# Patient Record
Sex: Male | Born: 1940 | Race: White | Hispanic: No | Smoking: Never smoker
Health system: Southern US, Community
[De-identification: ages and names within clinical notes are randomized; demographics above are authoritative.]

## PROBLEM LIST (undated history)

## (undated) DIAGNOSIS — E119 Type 2 diabetes mellitus without complications: Secondary | ICD-10-CM

## (undated) HISTORY — PX: TONSILLECTOMY AND ADENOIDECTOMY: SHX28

## (undated) HISTORY — DX: Type 2 diabetes mellitus without complications: E11.9

---

## 2009-06-19 ENCOUNTER — Ambulatory Visit: Payer: Self-pay | Admitting: Gastroenterology

## 2012-05-11 ENCOUNTER — Ambulatory Visit: Payer: Self-pay | Admitting: General Practice

## 2012-07-04 ENCOUNTER — Ambulatory Visit: Payer: Self-pay | Admitting: Unknown Physician Specialty

## 2014-06-15 ENCOUNTER — Ambulatory Visit: Payer: Self-pay | Admitting: Family Medicine

## 2014-07-16 ENCOUNTER — Ambulatory Visit: Payer: Self-pay | Admitting: Family Medicine

## 2015-07-30 IMAGING — CT CT CHEST W/ CM
2 of 3 series · 15 of 36 positions shown, 18 images · IV contrast (omnipaque)
Comparison: Report of chest radiograph performed 06/06/2014, images
not available

CLINICAL DATA: Subsequent evaluation of abnormal opacity right lung
on recent chest radiograph

EXAM:
CT CHEST WITH CONTRAST
TECHNIQUE: Multidetector CT imaging of the chest was performed during
intravenous contrast administration.
CONTRAST:  75 mL Omnipaque 350

[Series 2: routine chest with · axial · 0.77mm/px · z∈[-717,-432]mm · 12 of 67 slices shown, 15 images]
[im 5/67  mediastinal]
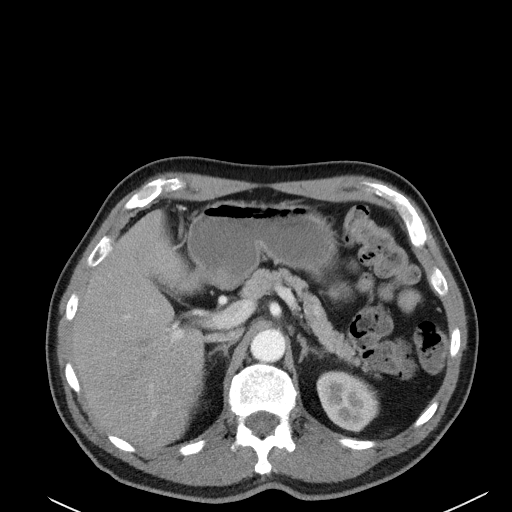
[im 5/67  lung]
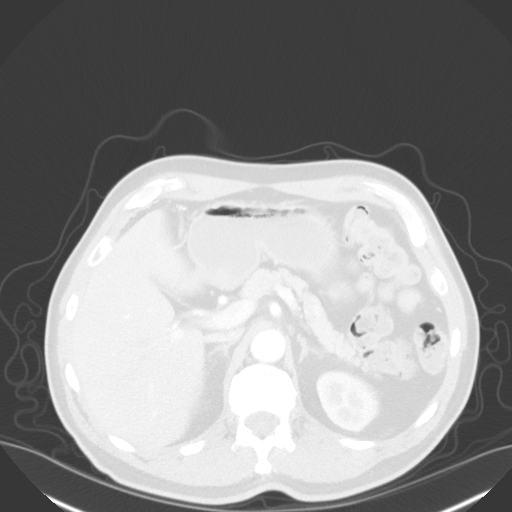
[im 10/67  lung]
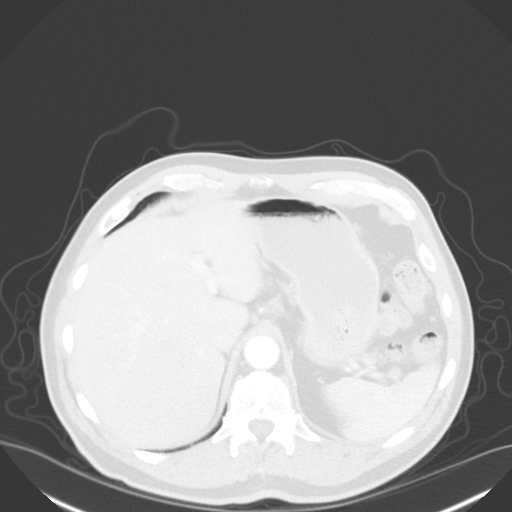
[im 15/67  lung]
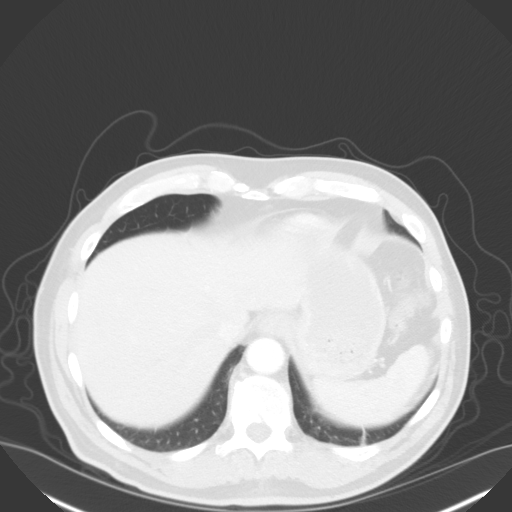
[im 20/67  lung]
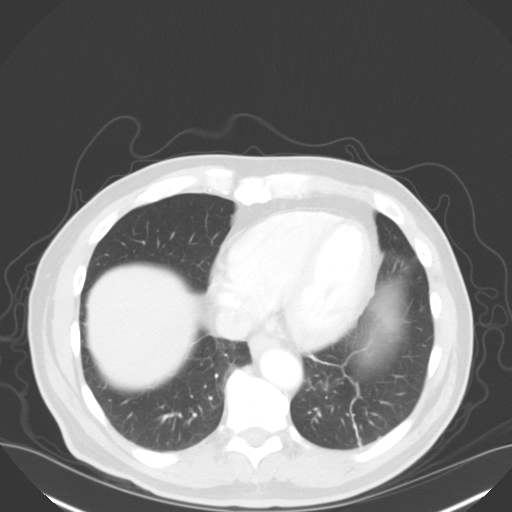
[im 25/67  mediastinal]
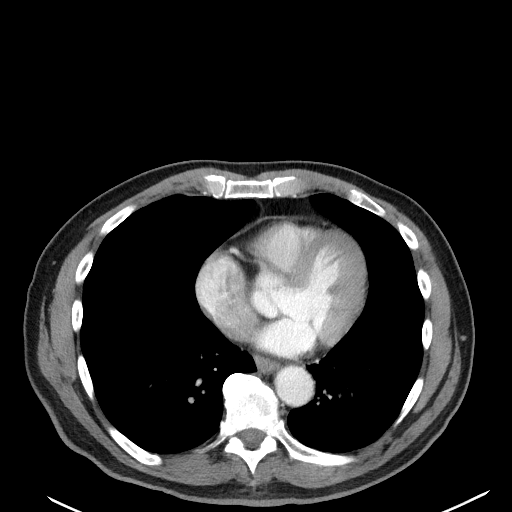
[im 25/67  lung]
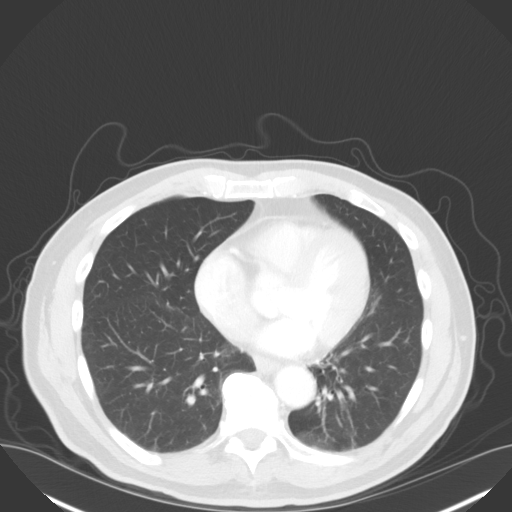
[im 30/67  lung]
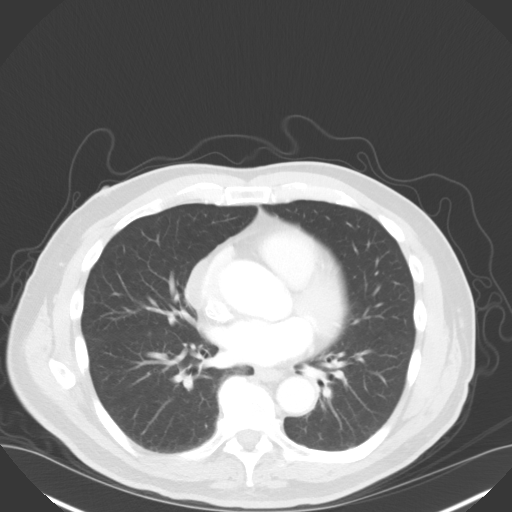
[im 37/67  lung]
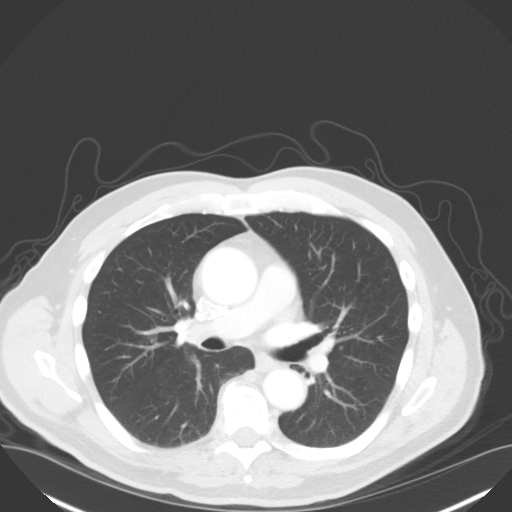
[im 42/67  lung]
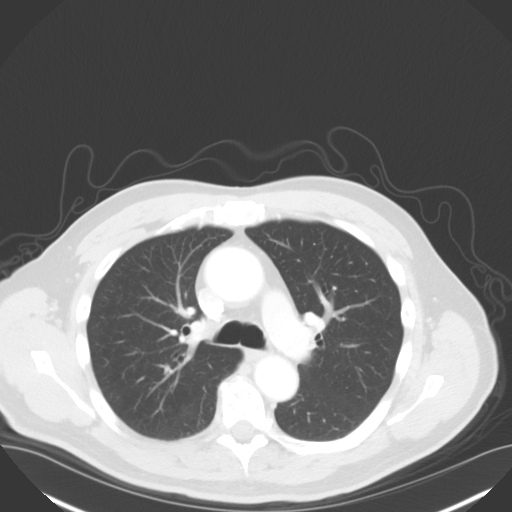
[im 47/67  mediastinal]
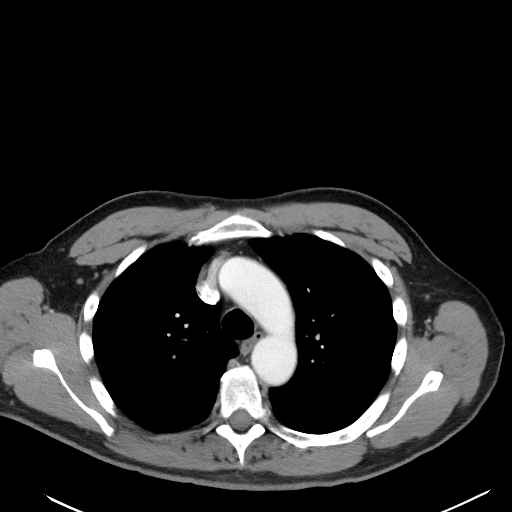
[im 47/67  lung]
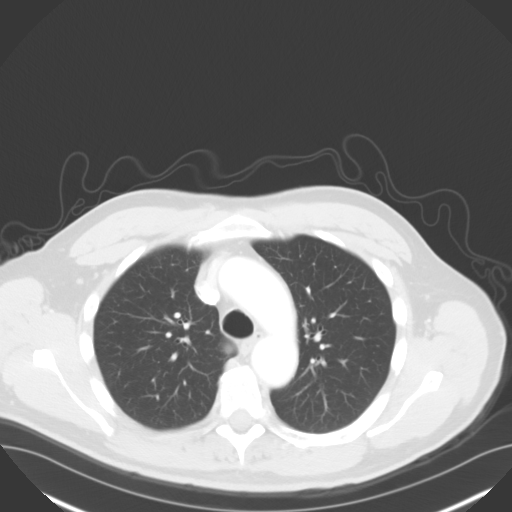
[im 52/67  lung]
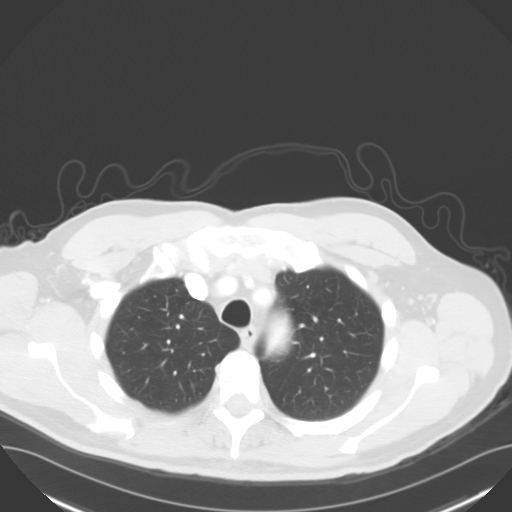
[im 57/67  lung]
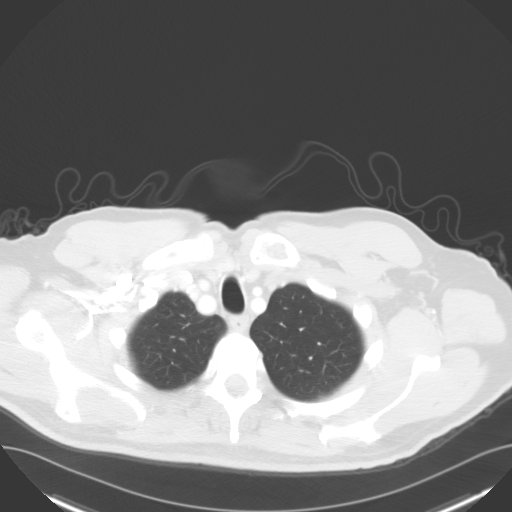
[im 62/67  lung]
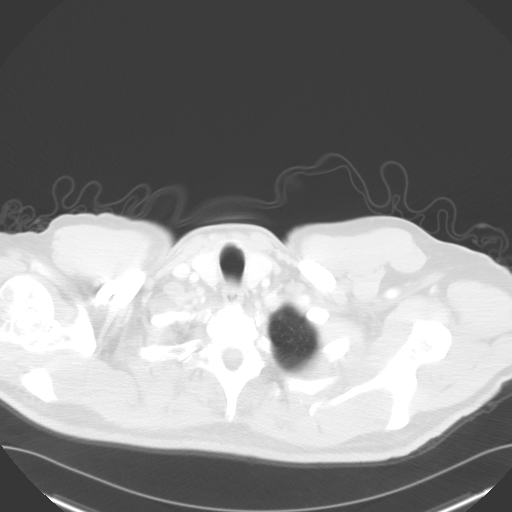

[Series 5: cor routine chest with · coronal · 0.66mm/px · 3 of 149 slices shown]
[im 30/149  lung]
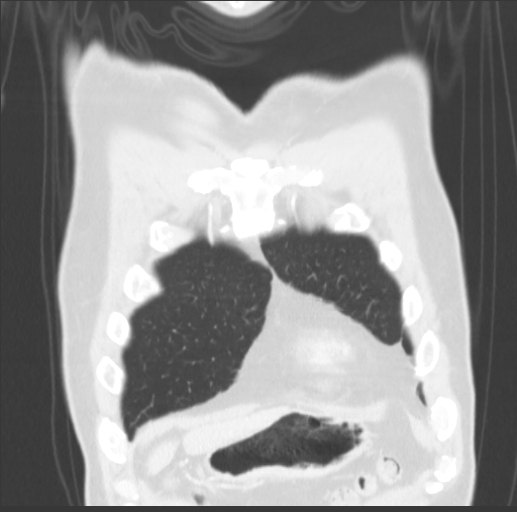
[im 60/149  lung]
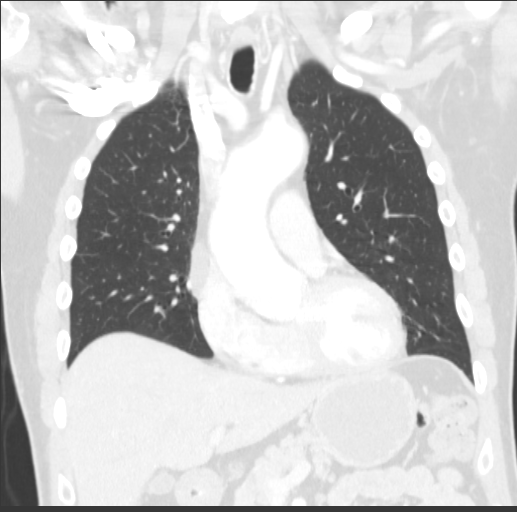
[im 89/149  lung]
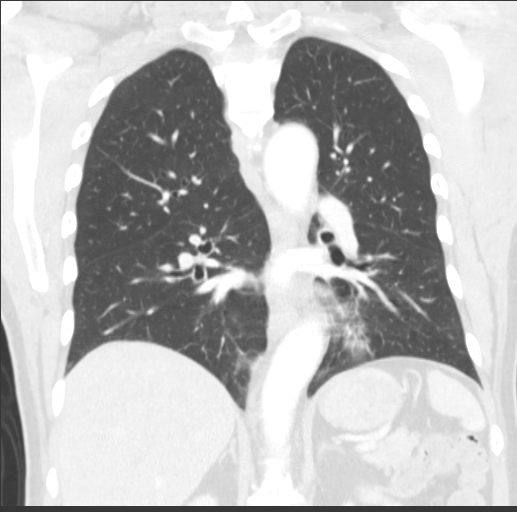

[15 of 36 positions shown; findings below may reference images not displayed]

FINDINGS: Thoracic inlet is normal. Thyroid is normal. No significant hilar or
mediastinal adenopathy. Mediastinal contents normal. No pleural or
pericardial effusion. Heart size normal.

Images through the upper abdomen unremarkable. No acute
musculoskeletal findings.

In the posterior left lower lobe there is mild discoid atelectasis.
The left lung is clear otherwise. On the right, there is very mild
perihilar infiltrate and there are several tiny nodules, both solid
and sub-solid, the largest measuring 4mm and seen on image 30 series
3.
IMPRESSION: Mild right perihilar inflammatory change with nodules. Although
comparison is not possible as prior films are not available, the
findings are sufficiently mild to conclude that the process has
likely improved, given that it was previously visible on a chest
radiograph.I would suggest repeat chest CT in two to four weeks to
determine if complete resolution occurs and, if findings persist,
the patient should then be entered into longer-term followup to
exclude malignancy.

## 2017-04-27 ENCOUNTER — Ambulatory Visit (INDEPENDENT_AMBULATORY_CARE_PROVIDER_SITE_OTHER)
Admission: RE | Admit: 2017-04-27 | Discharge: 2017-04-27 | Disposition: A | Discharge status: Home / Self Care | Payer: Medicare Other | Source: Ambulatory Visit | Attending: Family Medicine | Admitting: Family Medicine

## 2017-04-27 ENCOUNTER — Ambulatory Visit (INDEPENDENT_AMBULATORY_CARE_PROVIDER_SITE_OTHER): Payer: Medicare Other | Admitting: Family Medicine

## 2017-04-27 ENCOUNTER — Encounter: Payer: Self-pay | Admitting: Family Medicine

## 2017-04-27 VITALS — BP 106/70 | HR 68 | Temp 97.7°F | Ht 73.0 in | Wt 192.5 lb

## 2017-04-27 DIAGNOSIS — M7542 Impingement syndrome of left shoulder: Secondary | ICD-10-CM

## 2017-04-27 DIAGNOSIS — G8929 Other chronic pain: Secondary | ICD-10-CM | POA: Diagnosis not present

## 2017-04-27 DIAGNOSIS — M25512 Pain in left shoulder: Secondary | ICD-10-CM

## 2017-04-27 DIAGNOSIS — E119 Type 2 diabetes mellitus without complications: Secondary | ICD-10-CM

## 2017-04-27 HISTORY — DX: Type 2 diabetes mellitus without complications: E11.9

## 2017-04-27 NOTE — Progress Notes (Signed)
Dr. Frederico Hamman T. Vernon Maish, MD, [facility] [place], [address] Phone: [phone] Fax: [phone]  04/27/2017  Patient: Connor Warren, MRN: [id number], DOB: [birth year], 76 y.o.  Primary Physician:  Maryland Pink, MD   Chief Complaint  Patient presents with  . Shoulder Pain    Left   Subjective:   This 76 y.o. male patient noted above presents with shoulder pain that has been ongoing for 6 months. there is no history of trauma or accident recently The patient denies neck pain or radicular symptoms. Denies dislocation, subluxation, separation of the shoulder. The patient does complain of pain in the overhead plane with significant painful arc of motion - sometimes.   New patient, consult from Dr. Rock Nephew presents with L shoulder pain.  Lateral shoulder pain - right now won't hurt at all. Progressively has gotten a little bit worse. Impairing his ability to lift weights.  Medications Tried: Tylenol, NSAIDS Ice or Heat: minimally helpful Tried PT: No  Prior shoulder Injury: Not on the L side Prior surgery: R RTC repair Prior fracture: No  The PMH, PSH, Social History, Family History, Medications, and allergies have been reviewed in [facility], and have been updated if relevant.  Patient Active Problem List   Diagnosis Date Noted  . Controlled type 2 diabetes mellitus without complication, without long-term current use of insulin ([place]) 04/27/2017    Past Medical History:  Diagnosis Date  . Controlled type 2 diabetes mellitus without complication, without long-term current use of insulin ([place]) 04/27/2017  . Diabetes mellitus without complication [facility])     Past Surgical History:  Procedure Laterality Date  . TONSILLECTOMY AND ADENOIDECTOMY      Social History   Socioeconomic History  . Marital status: Married    Spouse name: Not on file  . Number of children: Not on file  . Years of education: Not on file  .  Highest education level: Not on file  Social Needs  . Financial resource strain: Not on file  . Food insecurity - worry: Not on file  . Food insecurity - inability: Not on file  . Transportation needs - medical: Not on file  . Transportation needs - non-medical: Not on file  Occupational History  . Not on file  Tobacco Use  . Smoking status: Never Smoker  . Smokeless tobacco: Never Used  Substance and Sexual Activity  . Alcohol use: Not on file  . Drug use: Not on file  . Sexual activity: Not on file  Other Topics Concern  . Not on file  Social History Narrative  . Not on file    History reviewed. No pertinent family history.  No Known Allergies  Medication list reviewed and updated in full in [place].  GEN: No fevers, chills. Nontoxic. Primarily MSK c/o today. MSK: Detailed in the HPI GI: tolerating PO intake without difficulty Neuro: No numbness, parasthesias, or tingling associated. Otherwise the pertinent positives of the ROS are noted above.   Objective:   Blood pressure 106/70, pulse 68, temperature 97.7 F (36.5 C), temperature source Oral, height 6\' 1"  (1.854 m), weight 192 lb 8 oz (87.3 kg).  GEN: Well-developed,well-nourished,in no acute distress; alert,appropriate and cooperative throughout examination HEENT: Normocephalic and atraumatic without obvious abnormalities. Ears, externally no deformities PULM: Breathing comfortably in no respiratory distress EXT: No clubbing, cyanosis, or edema PSYCH: Normally interactive. Cooperative during the interview. Pleasant. Friendly and conversant. Not anxious or depressed appearing. Normal, full  affect.  Shoulder: L Inspection: No muscle wasting or winging Ecchymosis/edema: neg  AC joint, scapula, clavicle: NT Cervical spine: NT, full ROM Spurling's: neg Abduction: full, 5/5 Flexion: full, 5/5 IR, full, lift-off: 5/5 ER at neutral: full, 5/5 AC crossover: neg Neer: neg Hawkins: pos Drop Test:  neg Empty Can: mild pos obriens is pos Supraspinatus insertion: mild-mod T Bicipital groove: NT Speed's: neg Yergason's: neg Sulcus sign: neg Scapular dyskinesis: none C5-T1 intact  Neuro: Sensation intact Grip 5/5   Radiology: Dg Shoulder Left  Result Date: 04/27/2017 CLINICAL DATA:  Chronic left shoulder EXAM: LEFT SHOULDER - 2+ VIEW COMPARISON:  None. FINDINGS: There is minimal degenerative joint disease of the left shoulder for age. There is some acromial spurring however which may result in impingement and possibly chronic rotator cuff disease. No soft tissue calcification is seen. No fracture is noted. IMPRESSION: Suspect impingement and possible chronic rotator cuff disease. No acute abnormality. Mild degenerative change. Electronically Signed   By: Ivar Drape M.D.   On: 04/27/2017 13:42     Assessment and Plan:    Impingement syndrome of left shoulder - Plan: Ambulatory referral to Physical Therapy  Chronic left shoulder pain - Plan: DG Shoulder Left  Subacromial spurring and likely subacromial impingement with associated RTC tendinosis and subac bursitis. Now not that particularly painful.   Retraining shoulder mechanics and function was emphasized to the patient with rehab done at least 5-6 days a week to depress [place] head.   The patient could benefit from formal PT to assist with scapular stabilization and RTC strengthening.  I appreciate the opportunity to evaluate this very friendly patient. If you have any question regarding her care or prognosis, do not hesitate to ask.    Cc: Dr. Rock Nephew, DC  Follow-up: 2 mo if needed  Orders Placed This Encounter  Procedures  . DG Shoulder Left  . Ambulatory referral to Physical Therapy    Signed,  Frederico Hamman T. Broadus Costilla, MD     Medication List        Accurate as of 04/27/17  1:44 PM. Always use your most recent med list.          metFORMIN 500 MG 24 hr tablet Commonly known as:  GLUCOPHAGE-XR   ONE TOUCH ULTRA  TEST test strip Generic drug:  glucose blood

## 2017-04-27 NOTE — Patient Instructions (Signed)

## 2017-04-28 ENCOUNTER — Encounter: Payer: Self-pay | Admitting: Family Medicine

## 2017-06-22 ENCOUNTER — Encounter: Payer: Self-pay | Admitting: *Deleted

## 2017-06-23 ENCOUNTER — Ambulatory Visit: Payer: Medicare Other | Admitting: Family Medicine

## 2017-06-23 ENCOUNTER — Other Ambulatory Visit: Payer: Self-pay

## 2017-06-23 ENCOUNTER — Encounter: Payer: Self-pay | Admitting: Family Medicine

## 2017-06-23 VITALS — BP 112/66 | HR 76 | Temp 97.8°F | Ht 73.0 in | Wt 197.5 lb

## 2017-06-23 DIAGNOSIS — M7542 Impingement syndrome of left shoulder: Secondary | ICD-10-CM

## 2017-06-23 DIAGNOSIS — M25512 Pain in left shoulder: Secondary | ICD-10-CM

## 2017-06-23 DIAGNOSIS — G8929 Other chronic pain: Secondary | ICD-10-CM

## 2017-06-23 NOTE — Progress Notes (Signed)
Dr. Frederico Hamman T. Syrus Nakama, MD, [facility] and Sports Medicine [place], [address] Phone: [phone] Fax: [phone]  06/23/2017  Patient: Connor Warren, MRN: [id number], DOB: June 10, 1940, 77 y.o.  Primary Physician:  Maryland Pink, MD   Chief Complaint  Patient presents with  . Follow-up    Shoulder Pain   Subjective:   Dariel Betzer is a 77 y.o. very pleasant male patient who presents with the following:  F/u L shoulder pain and impingement, has been doing PT and HEP: I originally saw him on 04/27/2017 referred by Dr. Rock Nephew.  Historically, he was extremely active, and also completed an power lifting with a former max bench press of 420 pounds.  Has been doing some PT and HEP. Sometimes will not hut at all and some like a toothache.  He still has some occasional symptoms, occasional pain with terminal internal range of motion and overall he does feeling better compared to how he was doing a few months ago.  Doing a lot better   Past Medical History, Surgical History, Social History, Family History, Problem List, Medications, and Allergies have been reviewed and updated if relevant.  Patient Active Problem List   Diagnosis Date Noted  . Controlled type 2 diabetes mellitus without complication, without long-term current use of insulin ([place]) 04/27/2017    Past Medical History:  Diagnosis Date  . Controlled type 2 diabetes mellitus without complication, without long-term current use of insulin ([place]) 04/27/2017  . Diabetes mellitus without complication [facility])     Past Surgical History:  Procedure Laterality Date  . TONSILLECTOMY AND ADENOIDECTOMY      Social History   Socioeconomic History  . Marital status: Married    Spouse name: Not on file  . Number of children: Not on file  . Years of education: Not on file  . Highest education level: Not on file  Social Needs  . Financial resource strain: Not on file  . Food insecurity -  worry: Not on file  . Food insecurity - inability: Not on file  . Transportation needs - medical: Not on file  . Transportation needs - non-medical: Not on file  Occupational History  . Not on file  Tobacco Use  . Smoking status: Never Smoker  . Smokeless tobacco: Never Used  Substance and Sexual Activity  . Alcohol use: Not on file  . Drug use: Not on file  . Sexual activity: Not on file  Other Topics Concern  . Not on file  Social History Narrative  . Not on file    History reviewed. No pertinent family history.  No Known Allergies  Medication list reviewed and updated in full in [place].  GEN: No fevers, chills. Nontoxic. Primarily MSK c/o today. MSK: Detailed in the HPI GI: tolerating PO intake without difficulty Neuro: No numbness, parasthesias, or tingling associated. Otherwise the pertinent positives of the ROS are noted above.   Objective:   BP 112/66   Pulse 76   Temp 97.8 F (36.6 C) (Oral)   Ht 6\' 1"  (1.854 m)   Wt 197 lb 8 oz (89.6 kg)   BMI 26.06 kg/m    GEN: Well-developed,well-nourished,in no acute distress; alert,appropriate and cooperative throughout examination HEENT: Normocephalic and atraumatic without obvious abnormalities. Ears, externally no deformities PULM: Breathing comfortably in no respiratory distress EXT: No clubbing, cyanosis, or edema PSYCH: Normally interactive. Cooperative during the interview. Pleasant. Friendly and conversant. Not anxious or depressed appearing. Normal, full  affect.  Shoulder: B Inspection: No muscle wasting or winging Ecchymosis/edema: neg  AC joint, scapula, clavicle: NT Cervical spine: NT, full ROM Spurling's: neg Abduction: full, 5/5 Flexion: full, 5/5 IR, full, lift-off: 5/5 ER at neutral: full, 5/5 AC crossover: mild on the L Neer: neg Hawkins: minimal to none Drop Test: neg Empty Can: neg Supraspinatus insertion: neg Bicipital groove: NT Speed's: neg Yergason's: neg Sulcus sign:  neg Scapular dyskinesis: none C5-T1 intact  Neuro: Sensation intact Grip 5/5   Radiology: Dg Shoulder Left  Result Date: 04/27/2017 CLINICAL DATA:  Chronic left shoulder EXAM: LEFT SHOULDER - 2+ VIEW COMPARISON:  None. FINDINGS: There is minimal degenerative joint disease of the left shoulder for age. There is some acromial spurring however which may result in impingement and possibly chronic rotator cuff disease. No soft tissue calcification is seen. No fracture is noted. IMPRESSION: Suspect impingement and possible chronic rotator cuff disease. No acute abnormality. Mild degenerative change. Electronically Signed   By: Ivar Drape M.D.   On: 04/27/2017 13:42   Assessment and Plan:   Chronic left shoulder pain  Impingement syndrome of left shoulder   Impingement is better on exam today. He still is 14 with some mild AC and GH OA. He likely will have some pain intermittently, but he should do better if he builds RTC and scap training into his lifting long term.  At this point, I would add nothing further. If he has a flare-up, I can always do a shoulder injection.  Follow-up: prn  Signed,  Angelyn Osterberg T. Evalynne Locurto, MD   Allergies as of 06/23/2017   No Known Allergies     Medication List        Accurate as of 06/23/17 11:59 PM. Always use your most recent med list.          metFORMIN 500 MG 24 hr tablet Commonly known as:  GLUCOPHAGE-XR Takes 2 tablets twice a day   ONE TOUCH ULTRA TEST test strip Generic drug:  glucose blood Use 1 each 2 (two) times daily. Use as instructed.

## 2017-06-27 ENCOUNTER — Encounter: Payer: Self-pay | Admitting: Family Medicine

## 2019-08-30 NOTE — Progress Notes (Signed)
08/31/19 9:44 AM   Connor Warren 15-Oct-1940 [id number]  Referring provider: Maryland Pink, MD [address] [place],  [place] [address]  Chief Complaint  Patient presents with  . Other    HPI: Connor Warren is a 79 y.o. white M with a hx of ED and diabetes type 2 mellitus presents today for the evaluation and management of Peyronie's disease. He was referred to [place] by Maryland Pink, MD.  -Referred to [place] by Maryland Pink, MD. 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 Controlled type 2 diabetes mellitus without complication, without long-term current use of insulin ([place]) 04/27/2017  . Diabetes mellitus without complication [facility])     Surgical History: Past Surgical History:  Procedure Laterality Date  . TONSILLECTOMY AND ADENOIDECTOMY      Home Medications:  Allergies as of 08/31/2019   No Known Allergies     Medication List       Accurate as of August 31, 2019  9:44 AM. If you have any questions, ask your nurse or doctor.        metFORMIN 500 MG 24 hr tablet Commonly known as: GLUCOPHAGE-XR Takes 2 tablets twice a day   metFORMIN 500 MG tablet Commonly known as: GLUCOPHAGE Take 1,000 mg by mouth 2 (two) times daily.   ONE TOUCH ULTRA TEST test strip Generic drug: glucose blood Use 1 each 2 (two) times daily. Use as instructed.   sildenafil 100 MG tablet Commonly known as: VIAGRA Take by mouth.       Allergies: No Known Allergies  Family History: No family history on file.  Social History:  reports that he has never smoked. He has never used smokeless tobacco. No history on file for alcohol and drug.   Physical Exam: BP 134/81   Pulse 69   Ht 6\' 1"  (1.854 m)   Wt 200 lb (90.7 kg)   BMI 26.39  kg/m   Constitutional:  Alert and oriented, No acute distress. HEENT: [place] AT, moist mucus membranes.  Trachea midline, no masses. Cardiovascular: No clubbing, cyanosis, or edema. Respiratory: Normal respiratory effort, no increased work of breathing. GU: No CVA tenderness. Uncircumcised, testis descended bilaterally with no mass and tenderness. 1 cm right distal plaque of right corpus. Curved towards base.  Skin: No rashes, bruises or suspicious lesions. Neurologic: Grossly intact, no focal deficits, moving all 4 extremities. Psychiatric: Normal mood and affect.  Assessment & Plan:    1. Peyronie's Disease  Based on patient's history of physical exam, findings are consistent with Peyronie's disease.  Treatment options and goals of treatment were discussed today in detail. Options including observation, penile plaque and graft, penile plication, placement of penile prosthesis, and injection of collagenase were all reviewed.  An benefits of each were discussed at length.  He was given literature on Xiaflex and if interested will call back.  Also discussed surgical management and referral to [facility] with Dr. Francesca Jewett if interested   2. Erectile dysfunction  Pt has difficulties with achieving and maintaining an erection which is effectively managed with Estevan Ryder, MD  [place] [address], [place], [place] [address] [phone]  I, Lucas Mallow, am acting as a Education administrator for  Dr. Nicki Reaper C. Leonardo Plaia,  I have reviewed the above documentation for accuracy and completeness, and I agree with the above.

## 2019-08-31 ENCOUNTER — Ambulatory Visit (INDEPENDENT_AMBULATORY_CARE_PROVIDER_SITE_OTHER): Payer: Medicare HMO | Admitting: Urology

## 2019-08-31 ENCOUNTER — Encounter: Payer: Self-pay | Admitting: Urology

## 2019-08-31 ENCOUNTER — Other Ambulatory Visit: Payer: Self-pay

## 2019-08-31 VITALS — BP 134/81 | HR 69 | Ht 73.0 in | Wt 200.0 lb

## 2019-08-31 DIAGNOSIS — N486 Induration penis plastica: Secondary | ICD-10-CM

## 2020-07-17 DIAGNOSIS — H25813 Combined forms of age-related cataract, bilateral: Secondary | ICD-10-CM | POA: Diagnosis not present

## 2020-07-24 DIAGNOSIS — H401131 Primary open-angle glaucoma, bilateral, mild stage: Secondary | ICD-10-CM | POA: Diagnosis not present

## 2020-07-24 DIAGNOSIS — H25813 Combined forms of age-related cataract, bilateral: Secondary | ICD-10-CM | POA: Diagnosis not present

## 2020-08-07 DIAGNOSIS — H25812 Combined forms of age-related cataract, left eye: Secondary | ICD-10-CM | POA: Diagnosis not present

## 2020-08-07 DIAGNOSIS — H2511 Age-related nuclear cataract, right eye: Secondary | ICD-10-CM | POA: Diagnosis not present

## 2020-08-19 DIAGNOSIS — Z20822 Contact with and (suspected) exposure to covid-19: Secondary | ICD-10-CM | POA: Diagnosis not present

## 2020-08-19 DIAGNOSIS — E119 Type 2 diabetes mellitus without complications: Secondary | ICD-10-CM | POA: Diagnosis not present

## 2020-08-28 DIAGNOSIS — H2512 Age-related nuclear cataract, left eye: Secondary | ICD-10-CM | POA: Diagnosis not present

## 2020-08-28 DIAGNOSIS — H25012 Cortical age-related cataract, left eye: Secondary | ICD-10-CM | POA: Diagnosis not present

## 2020-10-10 DIAGNOSIS — Z125 Encounter for screening for malignant neoplasm of prostate: Secondary | ICD-10-CM | POA: Diagnosis not present

## 2020-10-10 DIAGNOSIS — D649 Anemia, unspecified: Secondary | ICD-10-CM | POA: Diagnosis not present

## 2020-10-10 DIAGNOSIS — E1165 Type 2 diabetes mellitus with hyperglycemia: Secondary | ICD-10-CM | POA: Diagnosis not present

## 2020-10-24 DIAGNOSIS — Z0000 Encounter for general adult medical examination without abnormal findings: Secondary | ICD-10-CM | POA: Diagnosis not present

## 2020-10-24 DIAGNOSIS — E1165 Type 2 diabetes mellitus with hyperglycemia: Secondary | ICD-10-CM | POA: Diagnosis not present

## 2020-10-24 DIAGNOSIS — D649 Anemia, unspecified: Secondary | ICD-10-CM | POA: Diagnosis not present

## 2020-11-08 DIAGNOSIS — D225 Melanocytic nevi of trunk: Secondary | ICD-10-CM | POA: Diagnosis not present

## 2020-11-08 DIAGNOSIS — X32XXXA Exposure to sunlight, initial encounter: Secondary | ICD-10-CM | POA: Diagnosis not present

## 2020-11-08 DIAGNOSIS — L821 Other seborrheic keratosis: Secondary | ICD-10-CM | POA: Diagnosis not present

## 2020-11-08 DIAGNOSIS — L280 Lichen simplex chronicus: Secondary | ICD-10-CM | POA: Diagnosis not present

## 2020-11-08 DIAGNOSIS — D485 Neoplasm of uncertain behavior of skin: Secondary | ICD-10-CM | POA: Diagnosis not present

## 2020-11-08 DIAGNOSIS — L859 Epidermal thickening, unspecified: Secondary | ICD-10-CM | POA: Diagnosis not present

## 2020-11-08 DIAGNOSIS — L570 Actinic keratosis: Secondary | ICD-10-CM | POA: Diagnosis not present

## 2020-11-08 DIAGNOSIS — D2262 Melanocytic nevi of left upper limb, including shoulder: Secondary | ICD-10-CM | POA: Diagnosis not present

## 2020-11-08 DIAGNOSIS — D2261 Melanocytic nevi of right upper limb, including shoulder: Secondary | ICD-10-CM | POA: Diagnosis not present

## 2020-11-19 DIAGNOSIS — X32XXXA Exposure to sunlight, initial encounter: Secondary | ICD-10-CM | POA: Diagnosis not present

## 2020-11-19 DIAGNOSIS — L570 Actinic keratosis: Secondary | ICD-10-CM | POA: Diagnosis not present

## 2021-01-14 DIAGNOSIS — Z961 Presence of intraocular lens: Secondary | ICD-10-CM | POA: Diagnosis not present

## 2021-01-23 DIAGNOSIS — E1165 Type 2 diabetes mellitus with hyperglycemia: Secondary | ICD-10-CM | POA: Diagnosis not present

## 2021-01-30 DIAGNOSIS — Z23 Encounter for immunization: Secondary | ICD-10-CM | POA: Diagnosis not present

## 2021-01-30 DIAGNOSIS — E1165 Type 2 diabetes mellitus with hyperglycemia: Secondary | ICD-10-CM | POA: Diagnosis not present

## 2021-02-04 DIAGNOSIS — Z111 Encounter for screening for respiratory tuberculosis: Secondary | ICD-10-CM | POA: Diagnosis not present

## 2021-02-06 DIAGNOSIS — Z23 Encounter for immunization: Secondary | ICD-10-CM | POA: Diagnosis not present

## 2021-02-28 DIAGNOSIS — Z23 Encounter for immunization: Secondary | ICD-10-CM | POA: Diagnosis not present

## 2021-05-06 DIAGNOSIS — E1165 Type 2 diabetes mellitus with hyperglycemia: Secondary | ICD-10-CM | POA: Diagnosis not present

## 2021-05-08 DIAGNOSIS — E1165 Type 2 diabetes mellitus with hyperglycemia: Secondary | ICD-10-CM | POA: Diagnosis not present

## 2021-05-12 DIAGNOSIS — R051 Acute cough: Secondary | ICD-10-CM | POA: Diagnosis not present

## 2021-05-12 DIAGNOSIS — Z03818 Encounter for observation for suspected exposure to other biological agents ruled out: Secondary | ICD-10-CM | POA: Diagnosis not present

## 2021-05-12 DIAGNOSIS — E1165 Type 2 diabetes mellitus with hyperglycemia: Secondary | ICD-10-CM | POA: Diagnosis not present

## 2021-06-04 DIAGNOSIS — L570 Actinic keratosis: Secondary | ICD-10-CM | POA: Diagnosis not present

## 2021-06-04 DIAGNOSIS — D2261 Melanocytic nevi of right upper limb, including shoulder: Secondary | ICD-10-CM | POA: Diagnosis not present

## 2021-06-04 DIAGNOSIS — D2272 Melanocytic nevi of left lower limb, including hip: Secondary | ICD-10-CM | POA: Diagnosis not present

## 2021-06-04 DIAGNOSIS — L218 Other seborrheic dermatitis: Secondary | ICD-10-CM | POA: Diagnosis not present

## 2021-06-04 DIAGNOSIS — L821 Other seborrheic keratosis: Secondary | ICD-10-CM | POA: Diagnosis not present

## 2021-06-04 DIAGNOSIS — D225 Melanocytic nevi of trunk: Secondary | ICD-10-CM | POA: Diagnosis not present

## 2021-06-04 DIAGNOSIS — D2262 Melanocytic nevi of left upper limb, including shoulder: Secondary | ICD-10-CM | POA: Diagnosis not present

## 2021-06-04 DIAGNOSIS — D2271 Melanocytic nevi of right lower limb, including hip: Secondary | ICD-10-CM | POA: Diagnosis not present

## 2021-06-04 DIAGNOSIS — B09 Unspecified viral infection characterized by skin and mucous membrane lesions: Secondary | ICD-10-CM | POA: Diagnosis not present

## 2021-06-05 DIAGNOSIS — H903 Sensorineural hearing loss, bilateral: Secondary | ICD-10-CM | POA: Diagnosis not present

## 2021-07-30 DIAGNOSIS — E1165 Type 2 diabetes mellitus with hyperglycemia: Secondary | ICD-10-CM | POA: Diagnosis not present

## 2021-08-06 DIAGNOSIS — L989 Disorder of the skin and subcutaneous tissue, unspecified: Secondary | ICD-10-CM | POA: Diagnosis not present

## 2021-08-06 DIAGNOSIS — Z23 Encounter for immunization: Secondary | ICD-10-CM | POA: Diagnosis not present

## 2021-08-06 DIAGNOSIS — E1165 Type 2 diabetes mellitus with hyperglycemia: Secondary | ICD-10-CM | POA: Diagnosis not present

## 2021-10-30 DIAGNOSIS — E1165 Type 2 diabetes mellitus with hyperglycemia: Secondary | ICD-10-CM | POA: Diagnosis not present

## 2021-11-06 DIAGNOSIS — E119 Type 2 diabetes mellitus without complications: Secondary | ICD-10-CM | POA: Diagnosis not present

## 2021-11-06 DIAGNOSIS — E785 Hyperlipidemia, unspecified: Secondary | ICD-10-CM | POA: Diagnosis not present

## 2021-11-06 DIAGNOSIS — Z125 Encounter for screening for malignant neoplasm of prostate: Secondary | ICD-10-CM | POA: Diagnosis not present

## 2021-11-06 DIAGNOSIS — D649 Anemia, unspecified: Secondary | ICD-10-CM | POA: Diagnosis not present

## 2021-12-01 DIAGNOSIS — D2272 Melanocytic nevi of left lower limb, including hip: Secondary | ICD-10-CM | POA: Diagnosis not present

## 2021-12-01 DIAGNOSIS — L570 Actinic keratosis: Secondary | ICD-10-CM | POA: Diagnosis not present

## 2021-12-01 DIAGNOSIS — D2261 Melanocytic nevi of right upper limb, including shoulder: Secondary | ICD-10-CM | POA: Diagnosis not present

## 2021-12-01 DIAGNOSIS — L218 Other seborrheic dermatitis: Secondary | ICD-10-CM | POA: Diagnosis not present

## 2021-12-01 DIAGNOSIS — L821 Other seborrheic keratosis: Secondary | ICD-10-CM | POA: Diagnosis not present

## 2021-12-01 DIAGNOSIS — D225 Melanocytic nevi of trunk: Secondary | ICD-10-CM | POA: Diagnosis not present

## 2021-12-01 DIAGNOSIS — D2262 Melanocytic nevi of left upper limb, including shoulder: Secondary | ICD-10-CM | POA: Diagnosis not present

## 2022-01-14 DIAGNOSIS — L600 Ingrowing nail: Secondary | ICD-10-CM | POA: Diagnosis not present

## 2022-01-14 DIAGNOSIS — H401111 Primary open-angle glaucoma, right eye, mild stage: Secondary | ICD-10-CM | POA: Diagnosis not present

## 2022-01-14 DIAGNOSIS — E119 Type 2 diabetes mellitus without complications: Secondary | ICD-10-CM | POA: Diagnosis not present

## 2022-01-14 DIAGNOSIS — H5211 Myopia, right eye: Secondary | ICD-10-CM | POA: Diagnosis not present

## 2022-01-14 DIAGNOSIS — Z7984 Long term (current) use of oral hypoglycemic drugs: Secondary | ICD-10-CM | POA: Diagnosis not present

## 2022-01-14 DIAGNOSIS — Z961 Presence of intraocular lens: Secondary | ICD-10-CM | POA: Diagnosis not present

## 2022-01-14 DIAGNOSIS — H401122 Primary open-angle glaucoma, left eye, moderate stage: Secondary | ICD-10-CM | POA: Diagnosis not present

## 2022-01-14 DIAGNOSIS — L603 Nail dystrophy: Secondary | ICD-10-CM | POA: Diagnosis not present

## 2022-01-14 DIAGNOSIS — H524 Presbyopia: Secondary | ICD-10-CM | POA: Diagnosis not present

## 2022-02-25 DIAGNOSIS — R059 Cough, unspecified: Secondary | ICD-10-CM | POA: Diagnosis not present

## 2022-02-25 DIAGNOSIS — Z03818 Encounter for observation for suspected exposure to other biological agents ruled out: Secondary | ICD-10-CM | POA: Diagnosis not present

## 2022-03-02 DIAGNOSIS — E119 Type 2 diabetes mellitus without complications: Secondary | ICD-10-CM | POA: Diagnosis not present

## 2022-03-02 DIAGNOSIS — D649 Anemia, unspecified: Secondary | ICD-10-CM | POA: Diagnosis not present

## 2022-03-02 DIAGNOSIS — E785 Hyperlipidemia, unspecified: Secondary | ICD-10-CM | POA: Diagnosis not present

## 2022-03-02 DIAGNOSIS — Z125 Encounter for screening for malignant neoplasm of prostate: Secondary | ICD-10-CM | POA: Diagnosis not present

## 2022-03-05 DIAGNOSIS — D649 Anemia, unspecified: Secondary | ICD-10-CM | POA: Diagnosis not present

## 2022-03-05 DIAGNOSIS — L989 Disorder of the skin and subcutaneous tissue, unspecified: Secondary | ICD-10-CM | POA: Diagnosis not present

## 2022-03-05 DIAGNOSIS — Z125 Encounter for screening for malignant neoplasm of prostate: Secondary | ICD-10-CM | POA: Diagnosis not present

## 2022-03-05 DIAGNOSIS — Z23 Encounter for immunization: Secondary | ICD-10-CM | POA: Diagnosis not present

## 2022-03-05 DIAGNOSIS — E119 Type 2 diabetes mellitus without complications: Secondary | ICD-10-CM | POA: Diagnosis not present

## 2022-05-11 DIAGNOSIS — R0981 Nasal congestion: Secondary | ICD-10-CM | POA: Diagnosis not present

## 2022-05-11 DIAGNOSIS — R109 Unspecified abdominal pain: Secondary | ICD-10-CM | POA: Diagnosis not present

## 2022-05-11 DIAGNOSIS — Z03818 Encounter for observation for suspected exposure to other biological agents ruled out: Secondary | ICD-10-CM | POA: Diagnosis not present

## 2022-05-12 DIAGNOSIS — L218 Other seborrheic dermatitis: Secondary | ICD-10-CM | POA: Diagnosis not present

## 2022-05-12 DIAGNOSIS — M792 Neuralgia and neuritis, unspecified: Secondary | ICD-10-CM | POA: Diagnosis not present

## 2022-06-04 DIAGNOSIS — J301 Allergic rhinitis due to pollen: Secondary | ICD-10-CM | POA: Diagnosis not present

## 2022-06-04 DIAGNOSIS — J309 Allergic rhinitis, unspecified: Secondary | ICD-10-CM | POA: Diagnosis not present

## 2022-06-05 DIAGNOSIS — H401111 Primary open-angle glaucoma, right eye, mild stage: Secondary | ICD-10-CM | POA: Diagnosis not present

## 2022-06-05 DIAGNOSIS — H401122 Primary open-angle glaucoma, left eye, moderate stage: Secondary | ICD-10-CM | POA: Diagnosis not present

## 2022-06-26 DIAGNOSIS — J301 Allergic rhinitis due to pollen: Secondary | ICD-10-CM | POA: Diagnosis not present

## 2022-06-29 DIAGNOSIS — J301 Allergic rhinitis due to pollen: Secondary | ICD-10-CM | POA: Diagnosis not present

## 2022-07-02 DIAGNOSIS — J301 Allergic rhinitis due to pollen: Secondary | ICD-10-CM | POA: Diagnosis not present

## 2022-07-06 DIAGNOSIS — D649 Anemia, unspecified: Secondary | ICD-10-CM | POA: Diagnosis not present

## 2022-07-06 DIAGNOSIS — E119 Type 2 diabetes mellitus without complications: Secondary | ICD-10-CM | POA: Diagnosis not present

## 2022-07-06 DIAGNOSIS — J301 Allergic rhinitis due to pollen: Secondary | ICD-10-CM | POA: Diagnosis not present

## 2022-07-06 DIAGNOSIS — Z125 Encounter for screening for malignant neoplasm of prostate: Secondary | ICD-10-CM | POA: Diagnosis not present

## 2022-07-09 DIAGNOSIS — J301 Allergic rhinitis due to pollen: Secondary | ICD-10-CM | POA: Diagnosis not present

## 2022-07-13 DIAGNOSIS — E119 Type 2 diabetes mellitus without complications: Secondary | ICD-10-CM | POA: Diagnosis not present

## 2022-07-13 DIAGNOSIS — D649 Anemia, unspecified: Secondary | ICD-10-CM | POA: Diagnosis not present

## 2022-07-13 DIAGNOSIS — E785 Hyperlipidemia, unspecified: Secondary | ICD-10-CM | POA: Diagnosis not present

## 2022-07-13 DIAGNOSIS — E1165 Type 2 diabetes mellitus with hyperglycemia: Secondary | ICD-10-CM | POA: Diagnosis not present

## 2022-07-16 DIAGNOSIS — J301 Allergic rhinitis due to pollen: Secondary | ICD-10-CM | POA: Diagnosis not present

## 2022-07-23 DIAGNOSIS — J301 Allergic rhinitis due to pollen: Secondary | ICD-10-CM | POA: Diagnosis not present

## 2022-07-29 DIAGNOSIS — J301 Allergic rhinitis due to pollen: Secondary | ICD-10-CM | POA: Diagnosis not present

## 2022-08-05 DIAGNOSIS — J301 Allergic rhinitis due to pollen: Secondary | ICD-10-CM | POA: Diagnosis not present

## 2022-08-12 DIAGNOSIS — J301 Allergic rhinitis due to pollen: Secondary | ICD-10-CM | POA: Diagnosis not present

## 2022-08-19 DIAGNOSIS — J301 Allergic rhinitis due to pollen: Secondary | ICD-10-CM | POA: Diagnosis not present

## 2022-08-27 DIAGNOSIS — J309 Allergic rhinitis, unspecified: Secondary | ICD-10-CM | POA: Diagnosis not present

## 2022-08-27 DIAGNOSIS — J301 Allergic rhinitis due to pollen: Secondary | ICD-10-CM | POA: Diagnosis not present

## 2022-08-27 DIAGNOSIS — H6123 Impacted cerumen, bilateral: Secondary | ICD-10-CM | POA: Diagnosis not present

## 2022-09-02 DIAGNOSIS — J301 Allergic rhinitis due to pollen: Secondary | ICD-10-CM | POA: Diagnosis not present

## 2022-09-09 DIAGNOSIS — J301 Allergic rhinitis due to pollen: Secondary | ICD-10-CM | POA: Diagnosis not present

## 2022-09-16 DIAGNOSIS — J301 Allergic rhinitis due to pollen: Secondary | ICD-10-CM | POA: Diagnosis not present

## 2022-09-29 DIAGNOSIS — J301 Allergic rhinitis due to pollen: Secondary | ICD-10-CM | POA: Diagnosis not present

## 2022-10-01 DIAGNOSIS — J301 Allergic rhinitis due to pollen: Secondary | ICD-10-CM | POA: Diagnosis not present

## 2022-10-08 DIAGNOSIS — J301 Allergic rhinitis due to pollen: Secondary | ICD-10-CM | POA: Diagnosis not present

## 2022-10-14 DIAGNOSIS — J309 Allergic rhinitis, unspecified: Secondary | ICD-10-CM | POA: Diagnosis not present

## 2022-10-14 DIAGNOSIS — E119 Type 2 diabetes mellitus without complications: Secondary | ICD-10-CM | POA: Diagnosis not present

## 2022-10-15 DIAGNOSIS — J301 Allergic rhinitis due to pollen: Secondary | ICD-10-CM | POA: Diagnosis not present

## 2022-10-22 DIAGNOSIS — J301 Allergic rhinitis due to pollen: Secondary | ICD-10-CM | POA: Diagnosis not present

## 2022-10-29 DIAGNOSIS — J301 Allergic rhinitis due to pollen: Secondary | ICD-10-CM | POA: Diagnosis not present

## 2022-11-04 DIAGNOSIS — E1165 Type 2 diabetes mellitus with hyperglycemia: Secondary | ICD-10-CM | POA: Diagnosis not present

## 2022-11-04 DIAGNOSIS — E119 Type 2 diabetes mellitus without complications: Secondary | ICD-10-CM | POA: Diagnosis not present

## 2022-11-04 DIAGNOSIS — Z9889 Other specified postprocedural states: Secondary | ICD-10-CM | POA: Diagnosis not present

## 2022-11-04 DIAGNOSIS — Z961 Presence of intraocular lens: Secondary | ICD-10-CM | POA: Diagnosis not present

## 2022-11-04 DIAGNOSIS — H401134 Primary open-angle glaucoma, bilateral, indeterminate stage: Secondary | ICD-10-CM | POA: Diagnosis not present

## 2022-11-05 DIAGNOSIS — J301 Allergic rhinitis due to pollen: Secondary | ICD-10-CM | POA: Diagnosis not present

## 2022-11-11 DIAGNOSIS — H401134 Primary open-angle glaucoma, bilateral, indeterminate stage: Secondary | ICD-10-CM | POA: Diagnosis not present

## 2022-11-12 DIAGNOSIS — J301 Allergic rhinitis due to pollen: Secondary | ICD-10-CM | POA: Diagnosis not present

## 2022-11-26 DIAGNOSIS — J301 Allergic rhinitis due to pollen: Secondary | ICD-10-CM | POA: Diagnosis not present

## 2022-11-27 DIAGNOSIS — H401134 Primary open-angle glaucoma, bilateral, indeterminate stage: Secondary | ICD-10-CM | POA: Diagnosis not present

## 2022-12-03 DIAGNOSIS — J301 Allergic rhinitis due to pollen: Secondary | ICD-10-CM | POA: Diagnosis not present

## 2022-12-09 DIAGNOSIS — H401111 Primary open-angle glaucoma, right eye, mild stage: Secondary | ICD-10-CM | POA: Diagnosis not present

## 2022-12-09 DIAGNOSIS — H401123 Primary open-angle glaucoma, left eye, severe stage: Secondary | ICD-10-CM | POA: Diagnosis not present

## 2022-12-15 DIAGNOSIS — L821 Other seborrheic keratosis: Secondary | ICD-10-CM | POA: Diagnosis not present

## 2022-12-15 DIAGNOSIS — L570 Actinic keratosis: Secondary | ICD-10-CM | POA: Diagnosis not present

## 2022-12-15 DIAGNOSIS — D2261 Melanocytic nevi of right upper limb, including shoulder: Secondary | ICD-10-CM | POA: Diagnosis not present

## 2022-12-15 DIAGNOSIS — D2262 Melanocytic nevi of left upper limb, including shoulder: Secondary | ICD-10-CM | POA: Diagnosis not present

## 2022-12-15 DIAGNOSIS — D2271 Melanocytic nevi of right lower limb, including hip: Secondary | ICD-10-CM | POA: Diagnosis not present

## 2022-12-15 DIAGNOSIS — D225 Melanocytic nevi of trunk: Secondary | ICD-10-CM | POA: Diagnosis not present

## 2022-12-15 DIAGNOSIS — L218 Other seborrheic dermatitis: Secondary | ICD-10-CM | POA: Diagnosis not present

## 2022-12-15 DIAGNOSIS — D2272 Melanocytic nevi of left lower limb, including hip: Secondary | ICD-10-CM | POA: Diagnosis not present

## 2022-12-15 DIAGNOSIS — L578 Other skin changes due to chronic exposure to nonionizing radiation: Secondary | ICD-10-CM | POA: Diagnosis not present

## 2022-12-17 DIAGNOSIS — J301 Allergic rhinitis due to pollen: Secondary | ICD-10-CM | POA: Diagnosis not present

## 2022-12-24 DIAGNOSIS — J301 Allergic rhinitis due to pollen: Secondary | ICD-10-CM | POA: Diagnosis not present

## 2022-12-31 DIAGNOSIS — J301 Allergic rhinitis due to pollen: Secondary | ICD-10-CM | POA: Diagnosis not present

## 2023-01-01 DIAGNOSIS — H401123 Primary open-angle glaucoma, left eye, severe stage: Secondary | ICD-10-CM | POA: Diagnosis not present

## 2023-01-07 DIAGNOSIS — J301 Allergic rhinitis due to pollen: Secondary | ICD-10-CM | POA: Diagnosis not present

## 2023-01-08 DIAGNOSIS — H401111 Primary open-angle glaucoma, right eye, mild stage: Secondary | ICD-10-CM | POA: Diagnosis not present

## 2023-01-08 DIAGNOSIS — H401123 Primary open-angle glaucoma, left eye, severe stage: Secondary | ICD-10-CM | POA: Diagnosis not present

## 2023-01-21 DIAGNOSIS — J301 Allergic rhinitis due to pollen: Secondary | ICD-10-CM | POA: Diagnosis not present

## 2023-01-28 DIAGNOSIS — J301 Allergic rhinitis due to pollen: Secondary | ICD-10-CM | POA: Diagnosis not present

## 2023-02-04 DIAGNOSIS — J301 Allergic rhinitis due to pollen: Secondary | ICD-10-CM | POA: Diagnosis not present

## 2023-02-11 DIAGNOSIS — J301 Allergic rhinitis due to pollen: Secondary | ICD-10-CM | POA: Diagnosis not present

## 2023-02-16 DIAGNOSIS — Z125 Encounter for screening for malignant neoplasm of prostate: Secondary | ICD-10-CM | POA: Diagnosis not present

## 2023-02-16 DIAGNOSIS — Z1331 Encounter for screening for depression: Secondary | ICD-10-CM | POA: Diagnosis not present

## 2023-02-16 DIAGNOSIS — D649 Anemia, unspecified: Secondary | ICD-10-CM | POA: Diagnosis not present

## 2023-02-16 DIAGNOSIS — Z0000 Encounter for general adult medical examination without abnormal findings: Secondary | ICD-10-CM | POA: Diagnosis not present

## 2023-02-16 DIAGNOSIS — E119 Type 2 diabetes mellitus without complications: Secondary | ICD-10-CM | POA: Diagnosis not present

## 2023-02-16 DIAGNOSIS — E785 Hyperlipidemia, unspecified: Secondary | ICD-10-CM | POA: Diagnosis not present

## 2023-02-16 DIAGNOSIS — H409 Unspecified glaucoma: Secondary | ICD-10-CM | POA: Diagnosis not present

## 2023-02-18 DIAGNOSIS — J301 Allergic rhinitis due to pollen: Secondary | ICD-10-CM | POA: Diagnosis not present

## 2023-02-24 DIAGNOSIS — J301 Allergic rhinitis due to pollen: Secondary | ICD-10-CM | POA: Diagnosis not present

## 2023-02-25 DIAGNOSIS — J309 Allergic rhinitis, unspecified: Secondary | ICD-10-CM | POA: Diagnosis not present

## 2023-03-11 DIAGNOSIS — J301 Allergic rhinitis due to pollen: Secondary | ICD-10-CM | POA: Diagnosis not present

## 2023-03-16 DIAGNOSIS — J301 Allergic rhinitis due to pollen: Secondary | ICD-10-CM | POA: Diagnosis not present

## 2023-03-25 DIAGNOSIS — J301 Allergic rhinitis due to pollen: Secondary | ICD-10-CM | POA: Diagnosis not present

## 2023-04-01 DIAGNOSIS — J301 Allergic rhinitis due to pollen: Secondary | ICD-10-CM | POA: Diagnosis not present

## 2023-04-08 DIAGNOSIS — J301 Allergic rhinitis due to pollen: Secondary | ICD-10-CM | POA: Diagnosis not present

## 2023-04-15 DIAGNOSIS — J301 Allergic rhinitis due to pollen: Secondary | ICD-10-CM | POA: Diagnosis not present

## 2023-04-16 DIAGNOSIS — Z9889 Other specified postprocedural states: Secondary | ICD-10-CM | POA: Diagnosis not present

## 2023-04-16 DIAGNOSIS — H401123 Primary open-angle glaucoma, left eye, severe stage: Secondary | ICD-10-CM | POA: Diagnosis not present

## 2023-04-16 DIAGNOSIS — H401111 Primary open-angle glaucoma, right eye, mild stage: Secondary | ICD-10-CM | POA: Diagnosis not present

## 2023-04-16 DIAGNOSIS — Z961 Presence of intraocular lens: Secondary | ICD-10-CM | POA: Diagnosis not present

## 2023-04-22 DIAGNOSIS — J301 Allergic rhinitis due to pollen: Secondary | ICD-10-CM | POA: Diagnosis not present

## 2023-04-29 DIAGNOSIS — J301 Allergic rhinitis due to pollen: Secondary | ICD-10-CM | POA: Diagnosis not present

## 2023-05-13 DIAGNOSIS — J301 Allergic rhinitis due to pollen: Secondary | ICD-10-CM | POA: Diagnosis not present

## 2023-05-20 DIAGNOSIS — J301 Allergic rhinitis due to pollen: Secondary | ICD-10-CM | POA: Diagnosis not present

## 2023-05-27 DIAGNOSIS — J301 Allergic rhinitis due to pollen: Secondary | ICD-10-CM | POA: Diagnosis not present

## 2023-06-04 DIAGNOSIS — J301 Allergic rhinitis due to pollen: Secondary | ICD-10-CM | POA: Diagnosis not present

## 2023-06-10 DIAGNOSIS — J301 Allergic rhinitis due to pollen: Secondary | ICD-10-CM | POA: Diagnosis not present

## 2023-06-17 DIAGNOSIS — J301 Allergic rhinitis due to pollen: Secondary | ICD-10-CM | POA: Diagnosis not present

## 2023-06-24 DIAGNOSIS — J301 Allergic rhinitis due to pollen: Secondary | ICD-10-CM | POA: Diagnosis not present

## 2023-07-01 DIAGNOSIS — L600 Ingrowing nail: Secondary | ICD-10-CM | POA: Diagnosis not present

## 2023-07-01 DIAGNOSIS — R234 Changes in skin texture: Secondary | ICD-10-CM | POA: Diagnosis not present

## 2023-07-01 DIAGNOSIS — B351 Tinea unguium: Secondary | ICD-10-CM | POA: Diagnosis not present

## 2023-07-01 DIAGNOSIS — J301 Allergic rhinitis due to pollen: Secondary | ICD-10-CM | POA: Diagnosis not present

## 2023-07-01 DIAGNOSIS — E119 Type 2 diabetes mellitus without complications: Secondary | ICD-10-CM | POA: Diagnosis not present

## 2023-07-22 DIAGNOSIS — J301 Allergic rhinitis due to pollen: Secondary | ICD-10-CM | POA: Diagnosis not present

## 2023-07-29 DIAGNOSIS — J301 Allergic rhinitis due to pollen: Secondary | ICD-10-CM | POA: Diagnosis not present

## 2023-08-05 DIAGNOSIS — J301 Allergic rhinitis due to pollen: Secondary | ICD-10-CM | POA: Diagnosis not present

## 2023-08-11 DIAGNOSIS — D649 Anemia, unspecified: Secondary | ICD-10-CM | POA: Diagnosis not present

## 2023-08-11 DIAGNOSIS — Z125 Encounter for screening for malignant neoplasm of prostate: Secondary | ICD-10-CM | POA: Diagnosis not present

## 2023-08-11 DIAGNOSIS — E785 Hyperlipidemia, unspecified: Secondary | ICD-10-CM | POA: Diagnosis not present

## 2023-08-11 DIAGNOSIS — E119 Type 2 diabetes mellitus without complications: Secondary | ICD-10-CM | POA: Diagnosis not present

## 2023-08-12 DIAGNOSIS — J301 Allergic rhinitis due to pollen: Secondary | ICD-10-CM | POA: Diagnosis not present

## 2023-08-16 DIAGNOSIS — H409 Unspecified glaucoma: Secondary | ICD-10-CM | POA: Diagnosis not present

## 2023-08-16 DIAGNOSIS — Z125 Encounter for screening for malignant neoplasm of prostate: Secondary | ICD-10-CM | POA: Diagnosis not present

## 2023-08-16 DIAGNOSIS — E785 Hyperlipidemia, unspecified: Secondary | ICD-10-CM | POA: Diagnosis not present

## 2023-08-16 DIAGNOSIS — R052 Subacute cough: Secondary | ICD-10-CM | POA: Diagnosis not present

## 2023-08-16 DIAGNOSIS — J301 Allergic rhinitis due to pollen: Secondary | ICD-10-CM | POA: Diagnosis not present

## 2023-08-16 DIAGNOSIS — E119 Type 2 diabetes mellitus without complications: Secondary | ICD-10-CM | POA: Diagnosis not present

## 2023-08-18 DIAGNOSIS — H401123 Primary open-angle glaucoma, left eye, severe stage: Secondary | ICD-10-CM | POA: Diagnosis not present

## 2023-08-18 DIAGNOSIS — H401111 Primary open-angle glaucoma, right eye, mild stage: Secondary | ICD-10-CM | POA: Diagnosis not present

## 2023-08-21 DIAGNOSIS — H524 Presbyopia: Secondary | ICD-10-CM | POA: Diagnosis not present

## 2023-08-21 DIAGNOSIS — H52209 Unspecified astigmatism, unspecified eye: Secondary | ICD-10-CM | POA: Diagnosis not present

## 2023-08-21 DIAGNOSIS — H5213 Myopia, bilateral: Secondary | ICD-10-CM | POA: Diagnosis not present

## 2023-08-26 DIAGNOSIS — J301 Allergic rhinitis due to pollen: Secondary | ICD-10-CM | POA: Diagnosis not present

## 2023-09-02 DIAGNOSIS — J301 Allergic rhinitis due to pollen: Secondary | ICD-10-CM | POA: Diagnosis not present

## 2023-09-09 DIAGNOSIS — J301 Allergic rhinitis due to pollen: Secondary | ICD-10-CM | POA: Diagnosis not present

## 2023-09-16 DIAGNOSIS — J301 Allergic rhinitis due to pollen: Secondary | ICD-10-CM | POA: Diagnosis not present

## 2023-09-29 DIAGNOSIS — R5383 Other fatigue: Secondary | ICD-10-CM | POA: Diagnosis not present

## 2023-09-29 DIAGNOSIS — R051 Acute cough: Secondary | ICD-10-CM | POA: Diagnosis not present

## 2023-10-07 DIAGNOSIS — J301 Allergic rhinitis due to pollen: Secondary | ICD-10-CM | POA: Diagnosis not present

## 2023-10-11 DIAGNOSIS — H401123 Primary open-angle glaucoma, left eye, severe stage: Secondary | ICD-10-CM | POA: Diagnosis not present

## 2023-10-14 DIAGNOSIS — J301 Allergic rhinitis due to pollen: Secondary | ICD-10-CM | POA: Diagnosis not present

## 2023-10-21 DIAGNOSIS — J301 Allergic rhinitis due to pollen: Secondary | ICD-10-CM | POA: Diagnosis not present

## 2023-10-28 DIAGNOSIS — J301 Allergic rhinitis due to pollen: Secondary | ICD-10-CM | POA: Diagnosis not present

## 2023-11-04 DIAGNOSIS — J301 Allergic rhinitis due to pollen: Secondary | ICD-10-CM | POA: Diagnosis not present

## 2023-11-10 DIAGNOSIS — J301 Allergic rhinitis due to pollen: Secondary | ICD-10-CM | POA: Diagnosis not present

## 2023-11-11 DIAGNOSIS — J301 Allergic rhinitis due to pollen: Secondary | ICD-10-CM | POA: Diagnosis not present

## 2023-11-18 DIAGNOSIS — J301 Allergic rhinitis due to pollen: Secondary | ICD-10-CM | POA: Diagnosis not present

## 2023-11-25 DIAGNOSIS — J301 Allergic rhinitis due to pollen: Secondary | ICD-10-CM | POA: Diagnosis not present

## 2023-12-09 DIAGNOSIS — J301 Allergic rhinitis due to pollen: Secondary | ICD-10-CM | POA: Diagnosis not present

## 2023-12-16 DIAGNOSIS — J301 Allergic rhinitis due to pollen: Secondary | ICD-10-CM | POA: Diagnosis not present

## 2023-12-22 DIAGNOSIS — L570 Actinic keratosis: Secondary | ICD-10-CM | POA: Diagnosis not present

## 2023-12-22 DIAGNOSIS — D2261 Melanocytic nevi of right upper limb, including shoulder: Secondary | ICD-10-CM | POA: Diagnosis not present

## 2023-12-22 DIAGNOSIS — D2262 Melanocytic nevi of left upper limb, including shoulder: Secondary | ICD-10-CM | POA: Diagnosis not present

## 2023-12-22 DIAGNOSIS — D225 Melanocytic nevi of trunk: Secondary | ICD-10-CM | POA: Diagnosis not present

## 2023-12-22 DIAGNOSIS — D2272 Melanocytic nevi of left lower limb, including hip: Secondary | ICD-10-CM | POA: Diagnosis not present

## 2023-12-22 DIAGNOSIS — D485 Neoplasm of uncertain behavior of skin: Secondary | ICD-10-CM | POA: Diagnosis not present

## 2023-12-22 DIAGNOSIS — D2271 Melanocytic nevi of right lower limb, including hip: Secondary | ICD-10-CM | POA: Diagnosis not present

## 2023-12-22 DIAGNOSIS — C44719 Basal cell carcinoma of skin of left lower limb, including hip: Secondary | ICD-10-CM | POA: Diagnosis not present

## 2023-12-22 DIAGNOSIS — L821 Other seborrheic keratosis: Secondary | ICD-10-CM | POA: Diagnosis not present

## 2023-12-23 DIAGNOSIS — Z961 Presence of intraocular lens: Secondary | ICD-10-CM | POA: Diagnosis not present

## 2023-12-23 DIAGNOSIS — H401111 Primary open-angle glaucoma, right eye, mild stage: Secondary | ICD-10-CM | POA: Diagnosis not present

## 2023-12-23 DIAGNOSIS — J301 Allergic rhinitis due to pollen: Secondary | ICD-10-CM | POA: Diagnosis not present

## 2023-12-23 DIAGNOSIS — H401123 Primary open-angle glaucoma, left eye, severe stage: Secondary | ICD-10-CM | POA: Diagnosis not present

## 2023-12-30 DIAGNOSIS — J301 Allergic rhinitis due to pollen: Secondary | ICD-10-CM | POA: Diagnosis not present

## 2024-01-05 ENCOUNTER — Other Ambulatory Visit: Payer: Self-pay | Admitting: Medical Genetics

## 2024-01-06 DIAGNOSIS — J301 Allergic rhinitis due to pollen: Secondary | ICD-10-CM | POA: Diagnosis not present

## 2024-01-10 DIAGNOSIS — C44719 Basal cell carcinoma of skin of left lower limb, including hip: Secondary | ICD-10-CM | POA: Diagnosis not present

## 2024-01-10 DIAGNOSIS — D0461 Carcinoma in situ of skin of right upper limb, including shoulder: Secondary | ICD-10-CM | POA: Diagnosis not present

## 2024-01-13 DIAGNOSIS — J301 Allergic rhinitis due to pollen: Secondary | ICD-10-CM | POA: Diagnosis not present

## 2024-01-26 DIAGNOSIS — J301 Allergic rhinitis due to pollen: Secondary | ICD-10-CM | POA: Diagnosis not present

## 2024-01-28 DIAGNOSIS — J301 Allergic rhinitis due to pollen: Secondary | ICD-10-CM | POA: Diagnosis not present

## 2024-02-01 DIAGNOSIS — H401111 Primary open-angle glaucoma, right eye, mild stage: Secondary | ICD-10-CM | POA: Diagnosis not present

## 2024-02-01 DIAGNOSIS — H401123 Primary open-angle glaucoma, left eye, severe stage: Secondary | ICD-10-CM | POA: Diagnosis not present

## 2024-02-02 ENCOUNTER — Other Ambulatory Visit: Payer: Self-pay

## 2024-02-03 ENCOUNTER — Other Ambulatory Visit: Payer: Self-pay

## 2024-02-04 ENCOUNTER — Other Ambulatory Visit: Payer: Self-pay

## 2024-02-09 ENCOUNTER — Other Ambulatory Visit
Admission: RE | Admit: 2024-02-09 | Discharge: 2024-02-09 | Disposition: A | Discharge status: Home / Self Care | Payer: Self-pay | Source: Ambulatory Visit | Attending: Medical Genetics | Admitting: Medical Genetics

## 2024-02-09 DIAGNOSIS — E785 Hyperlipidemia, unspecified: Secondary | ICD-10-CM | POA: Diagnosis not present

## 2024-02-09 DIAGNOSIS — Z125 Encounter for screening for malignant neoplasm of prostate: Secondary | ICD-10-CM | POA: Diagnosis not present

## 2024-02-09 DIAGNOSIS — R052 Subacute cough: Secondary | ICD-10-CM | POA: Diagnosis not present

## 2024-02-09 DIAGNOSIS — E119 Type 2 diabetes mellitus without complications: Secondary | ICD-10-CM | POA: Diagnosis not present

## 2024-02-11 DIAGNOSIS — H401123 Primary open-angle glaucoma, left eye, severe stage: Secondary | ICD-10-CM | POA: Diagnosis not present

## 2024-02-11 DIAGNOSIS — H401111 Primary open-angle glaucoma, right eye, mild stage: Secondary | ICD-10-CM | POA: Diagnosis not present

## 2024-02-16 DIAGNOSIS — E785 Hyperlipidemia, unspecified: Secondary | ICD-10-CM | POA: Diagnosis not present

## 2024-02-16 DIAGNOSIS — D649 Anemia, unspecified: Secondary | ICD-10-CM | POA: Diagnosis not present

## 2024-02-16 DIAGNOSIS — Z1331 Encounter for screening for depression: Secondary | ICD-10-CM | POA: Diagnosis not present

## 2024-02-16 DIAGNOSIS — E1165 Type 2 diabetes mellitus with hyperglycemia: Secondary | ICD-10-CM | POA: Diagnosis not present

## 2024-02-16 DIAGNOSIS — Z0000 Encounter for general adult medical examination without abnormal findings: Secondary | ICD-10-CM | POA: Diagnosis not present

## 2024-02-17 DIAGNOSIS — J301 Allergic rhinitis due to pollen: Secondary | ICD-10-CM | POA: Diagnosis not present

## 2024-02-18 LAB — GENECONNECT MOLECULAR SCREEN: Genetic Analysis Overall Interpretation: NEGATIVE

## 2024-02-24 DIAGNOSIS — J301 Allergic rhinitis due to pollen: Secondary | ICD-10-CM | POA: Diagnosis not present

## 2024-02-28 DIAGNOSIS — J309 Allergic rhinitis, unspecified: Secondary | ICD-10-CM | POA: Diagnosis not present

## 2024-03-02 DIAGNOSIS — J301 Allergic rhinitis due to pollen: Secondary | ICD-10-CM | POA: Diagnosis not present
